# Patient Record
Sex: Female | Born: 2000 | Race: White | Hispanic: No | Marital: Single | Smoking: Never smoker
Health system: Southern US, Community
[De-identification: ages and names within clinical notes are randomized; demographics above are authoritative.]

## PROBLEM LIST (undated history)

## (undated) DIAGNOSIS — F909 Attention-deficit hyperactivity disorder, unspecified type: Secondary | ICD-10-CM

## (undated) DIAGNOSIS — F419 Anxiety disorder, unspecified: Secondary | ICD-10-CM

## (undated) DIAGNOSIS — Z8489 Family history of other specified conditions: Secondary | ICD-10-CM

## (undated) DIAGNOSIS — R21 Rash and other nonspecific skin eruption: Secondary | ICD-10-CM

## (undated) DIAGNOSIS — F32A Depression, unspecified: Secondary | ICD-10-CM

## (undated) HISTORY — DX: Rash and other nonspecific skin eruption: R21

## (undated) HISTORY — DX: Anxiety disorder, unspecified: F41.9

## (undated) HISTORY — DX: Attention-deficit hyperactivity disorder, unspecified type: F90.9

## (undated) HISTORY — PX: WISDOM TOOTH EXTRACTION: SHX21

---
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
## 2022-01-05 NOTE — Progress Notes (Unsigned)
   Established Patient Office Visit  Subjective   Patient ID: Victoria Hunter, female   DOB: [birth year] Age: 21 y.o. MRN: [id number]   No chief complaint on file.   HPI Pleasant 21 year old female presenting today to follow up on mood. About 6 weeks ago, her Wellbutrin dose was increased to 300mg  daily. She continued her Lexapro at 20mg  daily as instructed.   ROS    Objective:    There were no vitals filed for this visit.  Physical Exam   No results found for this or any previous visit (from the past 24 hour(s)).   {Labs (Optional):23779}  The ASCVD Risk score (Arnett DK, et al., 2019) failed to calculate for the following reasons:   The 2019 ASCVD risk score is only valid for ages 58 to 88   Assessment & Plan:   No problem-specific Assessment & Plan notes found for this encounter.   No follow-ups on file.  ___________________________________________ 43, DNP, APRN, FNP-BC Primary Care and Sports Medicine [facility] [place]

## 2022-01-06 ENCOUNTER — Ambulatory Visit (INDEPENDENT_AMBULATORY_CARE_PROVIDER_SITE_OTHER): Payer: Managed Care, Other (non HMO) | Admitting: Obstetrics & Gynecology

## 2022-01-06 ENCOUNTER — Encounter: Payer: Self-pay | Admitting: Obstetrics & Gynecology

## 2022-01-06 ENCOUNTER — Encounter: Payer: Self-pay | Admitting: Medical-Surgical

## 2022-01-06 ENCOUNTER — Ambulatory Visit (INDEPENDENT_AMBULATORY_CARE_PROVIDER_SITE_OTHER): Payer: Managed Care, Other (non HMO) | Admitting: Medical-Surgical

## 2022-01-06 VITALS — BP 123/85 | HR 119 | Resp 20 | Ht 64.0 in | Wt 164.0 lb

## 2022-01-06 VITALS — BP 129/91 | HR 106 | Resp 16 | Ht 64.0 in | Wt 164.0 lb

## 2022-01-06 DIAGNOSIS — F411 Generalized anxiety disorder: Secondary | ICD-10-CM | POA: Diagnosis not present

## 2022-01-06 DIAGNOSIS — F331 Major depressive disorder, recurrent, moderate: Secondary | ICD-10-CM | POA: Diagnosis not present

## 2022-01-06 DIAGNOSIS — Q524 Other congenital malformations of vagina: Secondary | ICD-10-CM

## 2022-01-06 DIAGNOSIS — Z01419 Encounter for gynecological examination (general) (routine) without abnormal findings: Secondary | ICD-10-CM

## 2022-01-06 NOTE — Progress Notes (Signed)
Subjective:     Victoria Hunter is a 21 y.o. female here for a routine exam.  Current complaints: hymal septum; can wear tampons with no issue.  No leaking around the tampon.  Has not had intercourse.    Gynecologic History Patient's last menstrual period was 12/12/2021. Contraception: abstinence Last Pap: n/a--age 42.   Obstetric History OB History  Gravida Para Term Preterm AB Living  0 0 0 0 0 0  SAB IAB Ectopic Multiple Live Births  0 0 0 0 0     The following portions of the patient's history were reviewed and updated as appropriate: allergies, current medications, past family history, past medical history, past social history, past surgical history, and problem list.  Review of Systems Pertinent items noted in HPI and remainder of comprehensive ROS otherwise negative.    Objective:     Vitals:   01/06/22 0956  BP: (!) 129/91  Pulse: (!) 106  Resp: 16  Weight: 164 lb (74.4 kg)  Height: 5\' 4"  (1.626 m)   Vitals:  WNL General appearance: alert, cooperative and no distress  HEENT: Normocephalic, without obvious abnormality, atraumatic Eyes: negative Throat: lips, mucosa, and tongue normal; teeth and gums normal  Respiratory: Clear to auscultation bilaterally  CV: Regular rate and rhythm  Breasts:  Normal appearance, no masses or tenderness, no nipple retraction or dimpling  GI: Soft, non-tender; bowel sounds normal; no masses,  no organomegaly  GU: External Genitalia:  Tanner V, no lesion Urethra:  No prolapse   Vagina: Pink, normal rugae, midline hymenal septum; thin.  No vaginal septum appreciated on exam.  Cervix: Not examined  Uterus:  Not examined  Adnexa: Not examined  Musculoskeletal: No edema, redness or tenderness in the calves or thighs  Skin: No lesions or rash  Lymphatic: Axillary adenopathy: none     Psychiatric: Normal mood and behavior        Assessment:    Healthy female exam.  Thin hymenal septum   Plan:    1.  Removal of hymenal septum  and exam under anesthesia  2.  Discussed birth control and condoms for STD protection (not sexually active at this time).  Has had a boyfriend in the past and may also have interest in females.

## 2022-01-23 ENCOUNTER — Telehealth: Payer: Self-pay | Admitting: *Deleted

## 2022-01-23 NOTE — Telephone Encounter (Signed)
Left patient a message about surgery scheduling. Patient's mother thought it was a referral.

## 2022-02-20 ENCOUNTER — Encounter (HOSPITAL_COMMUNITY): Payer: Self-pay | Admitting: Obstetrics & Gynecology

## 2022-02-20 NOTE — Progress Notes (Signed)
Victoria Hunter denies chest pain or shortness of breath. Patient denies having any s/s of Covid in her household, also denies any known exposure to Covid.   Nyelle's PCP is Christen Butter, NP.

## 2022-02-21 ENCOUNTER — Ambulatory Visit (HOSPITAL_COMMUNITY): Payer: Managed Care, Other (non HMO) | Admitting: Anesthesiology

## 2022-02-21 ENCOUNTER — Ambulatory Visit (HOSPITAL_BASED_OUTPATIENT_CLINIC_OR_DEPARTMENT_OTHER): Payer: Managed Care, Other (non HMO) | Admitting: Anesthesiology

## 2022-02-21 ENCOUNTER — Ambulatory Visit (HOSPITAL_COMMUNITY)
Admission: RE | Admit: 2022-02-21 | Discharge: 2022-02-21 | Disposition: A | Discharge status: Home / Self Care | Payer: Managed Care, Other (non HMO) | Source: Ambulatory Visit | Attending: Obstetrics & Gynecology | Admitting: Obstetrics & Gynecology

## 2022-02-21 ENCOUNTER — Other Ambulatory Visit: Payer: Self-pay

## 2022-02-21 ENCOUNTER — Encounter (HOSPITAL_COMMUNITY): Payer: Self-pay | Admitting: Obstetrics & Gynecology

## 2022-02-21 ENCOUNTER — Encounter (HOSPITAL_COMMUNITY)
Admission: RE | Disposition: A | Discharge status: Home / Self Care | Payer: Self-pay | Source: Ambulatory Visit | Attending: Obstetrics & Gynecology

## 2022-02-21 DIAGNOSIS — Q524 Other congenital malformations of vagina: Secondary | ICD-10-CM | POA: Diagnosis present

## 2022-02-21 DIAGNOSIS — Q523 Imperforate hymen: Secondary | ICD-10-CM

## 2022-02-21 HISTORY — PX: HYMENECTOMY: SHX5853

## 2022-02-21 HISTORY — DX: Family history of other specified conditions: Z84.89

## 2022-02-21 HISTORY — DX: Depression, unspecified: F32.A

## 2022-02-21 LAB — CBC
HCT: 38.4 % (ref 36.0–46.0)
Hemoglobin: 12.7 g/dL (ref 12.0–15.0)
MCH: 29.1 pg (ref 26.0–34.0)
MCHC: 33.1 g/dL (ref 30.0–36.0)
MCV: 88.1 fL (ref 80.0–100.0)
Platelets: 364 10*3/uL (ref 150–400)
RBC: 4.36 MIL/uL (ref 3.87–5.11)
RDW: 12.2 % (ref 11.5–15.5)
WBC: 6.5 10*3/uL (ref 4.0–10.5)
nRBC: 0 % (ref 0.0–0.2)

## 2022-02-21 LAB — POCT PREGNANCY, URINE: Preg Test, Ur: NEGATIVE

## 2022-02-21 SURGERY — EXAM UNDER ANESTHESIA
Anesthesia: General | Site: Vagina

## 2022-02-21 MED ORDER — LACTATED RINGERS IV SOLN: INTRAVENOUS | Status: DC

## 2022-02-21 MED ORDER — PROPOFOL 10 MG/ML IV BOLUS
INTRAVENOUS | Status: DC | PRN
  Administered 2022-02-21: 200 mg via INTRAVENOUS

## 2022-02-21 MED ORDER — KETOROLAC TROMETHAMINE 30 MG/ML IJ SOLN
INTRAMUSCULAR | Status: DC | PRN
  Administered 2022-02-21: 30 mg via INTRAVENOUS

## 2022-02-21 MED ORDER — FENTANYL CITRATE (PF) 250 MCG/5ML IJ SOLN
INTRAMUSCULAR | Status: DC | PRN
Start: 2022-02-21 — End: 2022-02-21
  Administered 2022-02-21: 100 ug via INTRAVENOUS

## 2022-02-21 MED ORDER — ONDANSETRON HCL 4 MG/2ML IJ SOLN
INTRAMUSCULAR | Status: AC
  Filled 2022-02-21: qty 2

## 2022-02-21 MED ORDER — LIDOCAINE HCL 1 % IJ SOLN
INTRAMUSCULAR | Status: AC
Start: 2022-02-21 — End: ?
  Filled 2022-02-21: qty 20

## 2022-02-21 MED ORDER — LIDOCAINE 2% (20 MG/ML) 5 ML SYRINGE
INTRAMUSCULAR | Status: DC | PRN
  Administered 2022-02-21: 60 mg via INTRAVENOUS

## 2022-02-21 MED ORDER — LIDOCAINE 2% (20 MG/ML) 5 ML SYRINGE
INTRAMUSCULAR | Status: AC
Start: 2022-02-21 — End: ?
  Filled 2022-02-21: qty 5

## 2022-02-21 MED ORDER — MIDAZOLAM HCL 2 MG/2ML IJ SOLN
INTRAMUSCULAR | Status: AC
Start: 2022-02-21 — End: ?
  Filled 2022-02-21: qty 2

## 2022-02-21 MED ORDER — SCOPOLAMINE 1 MG/3DAYS TD PT72: 1.0000 | MEDICATED_PATCH | TRANSDERMAL | Status: DC

## 2022-02-21 MED ORDER — LIDOCAINE HCL 1 % IJ SOLN
INTRAMUSCULAR | Status: DC | PRN
  Administered 2022-02-21: 2 mL

## 2022-02-21 MED ORDER — SCOPOLAMINE 1 MG/3DAYS TD PT72
MEDICATED_PATCH | TRANSDERMAL | Status: AC
  Administered 2022-02-21: 1.5 mg via TRANSDERMAL
  Filled 2022-02-21: qty 1

## 2022-02-21 MED ORDER — CHLORHEXIDINE GLUCONATE 0.12 % MT SOLN: 15.0000 mL | Freq: Once | OROMUCOSAL | Status: AC

## 2022-02-21 MED ORDER — ONDANSETRON HCL 4 MG/2ML IJ SOLN
INTRAMUSCULAR | Status: DC | PRN
  Administered 2022-02-21: 4 mg via INTRAVENOUS

## 2022-02-21 MED ORDER — ACETAMINOPHEN 500 MG PO TABS
ORAL_TABLET | ORAL | Status: AC
  Administered 2022-02-21: 1000 mg via ORAL
  Filled 2022-02-21: qty 2

## 2022-02-21 MED ORDER — ORAL CARE MOUTH RINSE: 15.0000 mL | Freq: Once | OROMUCOSAL | Status: AC

## 2022-02-21 MED ORDER — MIDAZOLAM HCL 2 MG/2ML IJ SOLN
INTRAMUSCULAR | Status: DC | PRN
  Administered 2022-02-21: 2 mg via INTRAVENOUS

## 2022-02-21 MED ORDER — 0.9 % SODIUM CHLORIDE (POUR BTL) OPTIME
TOPICAL | Status: DC | PRN
  Administered 2022-02-21: 1000 mL

## 2022-02-21 MED ORDER — ACETAMINOPHEN 500 MG PO TABS: 1000.0000 mg | ORAL_TABLET | Freq: Once | ORAL | Status: AC

## 2022-02-21 MED ORDER — CHLORHEXIDINE GLUCONATE 0.12 % MT SOLN
OROMUCOSAL | Status: AC
  Administered 2022-02-21: 15 mL via OROMUCOSAL
  Filled 2022-02-21: qty 15

## 2022-02-21 MED ORDER — PROPOFOL 10 MG/ML IV BOLUS
INTRAVENOUS | Status: AC
  Filled 2022-02-21: qty 20

## 2022-02-21 MED ORDER — DEXAMETHASONE SODIUM PHOSPHATE 10 MG/ML IJ SOLN
INTRAMUSCULAR | Status: DC | PRN
  Administered 2022-02-21: 10 mg via INTRAVENOUS

## 2022-02-21 MED ORDER — KETOROLAC TROMETHAMINE 30 MG/ML IJ SOLN
INTRAMUSCULAR | Status: AC
  Filled 2022-02-21: qty 1

## 2022-02-21 MED ORDER — FENTANYL CITRATE (PF) 250 MCG/5ML IJ SOLN
INTRAMUSCULAR | Status: AC
  Filled 2022-02-21: qty 5

## 2022-02-21 MED ORDER — DEXAMETHASONE SODIUM PHOSPHATE 10 MG/ML IJ SOLN
INTRAMUSCULAR | Status: AC
  Filled 2022-02-21: qty 1

## 2022-02-21 MED ORDER — ROCURONIUM BROMIDE 10 MG/ML (PF) SYRINGE
PREFILLED_SYRINGE | INTRAVENOUS | Status: AC
  Filled 2022-02-21: qty 10

## 2022-02-21 SURGICAL SUPPLY — 21 items
ELECT REM PT RETURN 9FT ADLT (ELECTROSURGICAL) ×1
ELECTRODE REM PT RTRN 9FT ADLT (ELECTROSURGICAL) IMPLANT
GLOVE BIO SURGEON STRL SZ7 (GLOVE) ×1 IMPLANT
GLOVE BIOGEL PI IND STRL 6.5 (GLOVE) ×1 IMPLANT
GLOVE BIOGEL PI IND STRL 7.0 (GLOVE) ×1 IMPLANT
GLOVE BIOGEL PI INDICATOR 6.5 (GLOVE) ×1
GLOVE BIOGEL PI INDICATOR 7.0 (GLOVE) ×1
GOWN STRL REUS W/ TWL LRG LVL3 (GOWN DISPOSABLE) ×2 IMPLANT
GOWN STRL REUS W/TWL LRG LVL3 (GOWN DISPOSABLE) ×2
NEEDLE HYPO 22GX1.5 SAFETY (NEEDLE) ×1 IMPLANT
NS IRRIG 1000ML POUR BTL (IV SOLUTION) ×1 IMPLANT
PACK VAGINAL MINOR WOMEN LF (CUSTOM PROCEDURE TRAY) ×1 IMPLANT
PAD OB MATERNITY 4.3X12.25 (PERSONAL CARE ITEMS) ×1 IMPLANT
PENCIL BUTTON HOLSTER BLD 10FT (ELECTRODE) IMPLANT
SUT MON AB 3-0 SH 27 (SUTURE) ×1
SUT MON AB 3-0 SH27 (SUTURE) ×2 IMPLANT
SUT VIC AB 3-0 SH 27 (SUTURE) ×1
SUT VIC AB 3-0 SH 27XBRD (SUTURE) IMPLANT
SYR 20ML LL LF (SYRINGE) ×1 IMPLANT
TOWEL GREEN STERILE FF (TOWEL DISPOSABLE) ×2 IMPLANT
YANKAUER SUCT BULB TIP NO VENT (SUCTIONS) IMPLANT

## 2022-02-21 NOTE — Op Note (Signed)
PATIENT:  Victoria Hunter  21 y.o. female  PRE-OPERATIVE DIAGNOSIS:  Thin hymenal septum  POST-OPERATIVE DIAGNOSIS:  Thin hymenal septum  PROCEDURE:  Procedure(s): EXAM UNDER ANESTHESIA (N/A) REMOVAL OF THIN HYMENAL SEPTUM (N/A)  SURGEON:  Surgeon(s) and Role:    * Lesly Dukes, MD - Primary  ANESTHESIA:   local and general  EBL:  0 mL   BLOOD ADMINISTERED:none  DRAINS: none   LOCAL MEDICATIONS USED:  LIDOCAINE   SPECIMEN:  No Specimen  DISPOSITION OF SPECIMEN:  N/A  COUNTS:  correct  TOURNIQUET:  * No tourniquets in log *  DICTATION: .Dragon Dictation  PLAN OF CARE: Discharge to home after PACU  PATIENT DISPOSITION:  PACU - hemodynamically stable.  PROCEDURE: After informed consent was obtained patient is taken to the operating room where general anesthesia was induced.  Patient was placed in the dorsal lithotomy position and prepared and draped in normal sterile fashion.  Exam under anesthesia was performed and the septate hymen was was the only vaginal abnormality.  Hemostats were used to clamp the septate hymen and it was transected and suture-ligated with 3-0 Monocryl.  This was done in both the superior and inferior aspects.  There was excellent hemostasis at the end of the procedure.  Patient went to the recovery room in stable condition.

## 2022-02-21 NOTE — Discharge Instructions (Signed)
Ibuprofen or Tylenol for pain Ice packs as needed Use mild soap to clean Use a spray bottle when urinating as needed Nothing per vagina for 2 weeks (no tampons) Call with heavy bleeding (spotting is OK), fever, uncontrolled pain, foul smelling discharge.

## 2022-02-21 NOTE — H&P (Signed)
Victoria Hunter is an 21 y.o. female. With a a septate hymen.  Pt is not sexually active yet and the septate hymen would make sexual intercourse difficult and painful.  Pertinent Gynecological History: Menses:  regular Bleeding: no intermenstraul bleeding Contraception: abstinence DES exposure: denies Blood transfusions: none Sexually transmitted diseases: no past history Previous GYN Procedures:  none   Last mammogram:  n/a   Last pap:  n/a age <19     Menstrual History:  Patient's last menstrual period was 02/17/2022.    Past Medical History:  Diagnosis Date   ADHD    Anxiety    Depression    Family history of adverse reaction to anesthesia    brother N/V   Rash     Past Surgical History:  Procedure Laterality Date   WISDOM TOOTH EXTRACTION      Family History  Problem Relation Age of Onset   Hypertension Mother    Cervical cancer Maternal Grandmother    Lung cancer Paternal Grandmother    Lung cancer Paternal Grandfather     Social History:  reports that she has never smoked. She has never used smokeless tobacco. She reports that she does not drink alcohol and does not use drugs.  Allergies: No Known Allergies  Medications Prior to Admission  Medication Sig Dispense Refill Last Dose   acetaminophen (TYLENOL) 500 MG tablet Take 1,000 mg by mouth every 6 (six) hours as needed (pain.).      buPROPion (WELLBUTRIN XL) 300 MG 24 hr tablet Take 1 tablet (300 mg total) by mouth every morning. 90 tablet 1    escitalopram (LEXAPRO) 20 MG tablet Take 1 tablet (20 mg total) by mouth daily. 90 tablet 1    ibuprofen (ADVIL) 200 MG tablet Take 400 mg by mouth every 8 (eight) hours as needed (pain.).      lisdexamfetamine (VYVANSE) 20 MG capsule Take 1 capsule (20 mg total) by mouth every morning. 30 capsule 0    lisdexamfetamine (VYVANSE) 20 MG capsule Take 1 capsule (20 mg total) by mouth every morning. (Patient not taking: Reported on 02/17/2022) 30 capsule 0 Not Taking    lisdexamfetamine (VYVANSE) 20 MG capsule Take 1 capsule (20 mg total) by mouth every morning. (Patient not taking: Reported on 02/17/2022) 30 capsule 0 Not Taking    Review of Systems  Blood pressure 118/79, pulse (!) 107, temperature 98.2 F (36.8 C), temperature source Oral, resp. rate 18, height 5\' 4"  (1.626 m), weight 74.8 kg, last menstrual period 02/17/2022, SpO2 95 %. Physical Exam  No results found for this or any previous visit (from the past 24 hour(s)).  No results found.  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  Office examination was uncomfortable and will proceed to removal in OR  02/19/2022 02/21/2022, 10:08 AM

## 2022-02-21 NOTE — Anesthesia Preprocedure Evaluation (Addendum)
Anesthesia Evaluation  Patient identified by MRN, date of birth, ID band Patient awake    Reviewed: Allergy & Precautions, NPO status , Patient's Chart, lab work & pertinent test results  History of Anesthesia Complications Negative for: history of anesthetic complications  Airway Mallampati: I  TM Distance: >3 FB Neck ROM: Full    Dental  (+) Teeth Intact, Dental Advisory Given   Pulmonary neg pulmonary ROS,    Pulmonary exam normal breath sounds clear to auscultation       Cardiovascular negative cardio ROS Normal cardiovascular exam Rhythm:Regular Rate:Normal     Neuro/Psych PSYCHIATRIC DISORDERS Anxiety Depression negative neurological ROS     GI/Hepatic negative GI ROS, Neg liver ROS,   Endo/Other  negative endocrine ROS  Renal/GU negative Renal ROS  negative genitourinary   Musculoskeletal negative musculoskeletal ROS (+)   Abdominal   Peds  Hematology negative hematology ROS (+)   Anesthesia Other Findings   Reproductive/Obstetrics negative OB ROS                           Anesthesia Physical Anesthesia Plan  ASA: 1  Anesthesia Plan: General   Post-op Pain Management: Tylenol PO (pre-op)*, Toradol IV (intra-op)* and Precedex   Induction: Intravenous  PONV Risk Score and Plan: 4 or greater and Ondansetron, Dexamethasone, Midazolam, Scopolamine patch - Pre-op and Treatment may vary due to age or medical condition  Airway Management Planned: LMA  Additional Equipment: None  Intra-op Plan:   Post-operative Plan: Extubation in OR  Informed Consent: I have reviewed the patients History and Physical, chart, labs and discussed the procedure including the risks, benefits and alternatives for the proposed anesthesia with the patient or authorized representative who has indicated his/her understanding and acceptance.     Dental advisory given  Plan Discussed with:  CRNA  Anesthesia Plan Comments:        Anesthesia Quick Evaluation

## 2022-02-21 NOTE — Brief Op Note (Signed)
02/21/2022  1:35 PM  PATIENT:  Victoria Hunter  21 y.o. female  PRE-OPERATIVE DIAGNOSIS:  Thin hymenal septum  POST-OPERATIVE DIAGNOSIS:  Thin hymenal septum  PROCEDURE:  Procedure(s): EXAM UNDER ANESTHESIA (N/A) REMOVAL OF THIN HYMENAL SEPTUM (N/A)  SURGEON:  Surgeon(s) and Role:    * Lesly Dukes, MD - Primary  ANESTHESIA:   local and general  EBL:  0 mL   BLOOD ADMINISTERED:none  DRAINS: none   LOCAL MEDICATIONS USED:  LIDOCAINE   SPECIMEN:  No Specimen  DISPOSITION OF SPECIMEN:  N/A  COUNTS:  correct  TOURNIQUET:  * No tourniquets in log *  DICTATION: .Dragon Dictation  PLAN OF CARE: Discharge to home after PACU  PATIENT DISPOSITION:  PACU - hemodynamically stable.   Delay start of Pharmacological VTE agent (>24hrs) due to surgical blood loss or risk of bleeding: not applicable

## 2022-02-21 NOTE — Transfer of Care (Signed)
Immediate Anesthesia Transfer of Care Note  Patient: Victoria Hunter  Procedure(s) Performed: Francia Greaves UNDER ANESTHESIA (Vagina ) REMOVAL OF THIN HYMENAL SEPTUM (Vagina )  Patient Location: PACU  Anesthesia Type:General  Level of Consciousness: sedated, patient cooperative and responds to stimulation  Airway & Oxygen Therapy: Patient Spontanous Breathing and Patient connected to face mask oxygen  Post-op Assessment: Report given to RN, Post -op Vital signs reviewed and stable, Patient moving all extremities X 4 and Patient able to stick tongue midline  Post vital signs: Reviewed  Last Vitals:  Vitals Value Taken Time  BP 98/69 02/21/22 1333  Temp 97.6   Pulse 61 02/21/22 1335  Resp 17 02/21/22 1335  SpO2 100 % 02/21/22 1335  Vitals shown include unvalidated device data.  Last Pain:  Vitals:   02/21/22 1019  TempSrc:   PainSc: 0-No pain         Complications: No notable events documented.

## 2022-02-21 NOTE — Anesthesia Postprocedure Evaluation (Signed)
Anesthesia Post Note  Patient: Kamille Toomey  Procedure(s) Performed: EXAM UNDER ANESTHESIA (Vagina ) REMOVAL OF THIN HYMENAL SEPTUM (Vagina )     Patient location during evaluation: PACU Anesthesia Type: General Level of consciousness: awake and alert Pain management: pain level controlled Vital Signs Assessment: post-procedure vital signs reviewed and stable Respiratory status: spontaneous breathing, nonlabored ventilation and respiratory function stable Cardiovascular status: blood pressure returned to baseline and stable Postop Assessment: no apparent nausea or vomiting Anesthetic complications: no   No notable events documented.  Last Vitals:  Vitals:   02/21/22 1345 02/21/22 1400  BP: 95/62 105/74  Pulse: 62 80  Resp: 14 15  Temp:    SpO2: 100% 97%    Last Pain:  Vitals:   02/21/22 1400  TempSrc:   PainSc: 0-No pain                 Lannie Fields

## 2022-02-21 NOTE — Anesthesia Procedure Notes (Signed)
Procedure Name: LMA Insertion Date/Time: 02/21/2022 12:59 PM  Performed by: Cy Blamer, CRNAPre-anesthesia Checklist: Patient identified, Emergency Drugs available, Suction available, Patient being monitored and Timeout performed Patient Re-evaluated:Patient Re-evaluated prior to induction Oxygen Delivery Method: Circle system utilized Preoxygenation: Pre-oxygenation with 100% oxygen Induction Type: IV induction LMA: LMA inserted LMA Size: 4.0 Number of attempts: 1 Placement Confirmation: positive ETCO2 and breath sounds checked- equal and bilateral Tube secured with: Tape Dental Injury: Teeth and Oropharynx as per pre-operative assessment

## 2022-02-22 ENCOUNTER — Encounter (HOSPITAL_COMMUNITY): Payer: Self-pay | Admitting: Obstetrics & Gynecology

## 2022-03-27 ENCOUNTER — Other Ambulatory Visit: Payer: Self-pay | Admitting: Medical-Surgical

## 2022-03-27 MED ORDER — LISDEXAMFETAMINE DIMESYLATE 20 MG PO CAPS: 20.0000 mg | ORAL_CAPSULE | ORAL | 0 refills | Status: DC

## 2022-04-07 NOTE — Progress Notes (Unsigned)
   Established Patient Office Visit  Subjective   Patient ID: Victoria Hunter, female   DOB: 10-12-2000 Age: 21 y.o. MRN: [id number]   No chief complaint on file.  HPI Pleasant 21 year old female presenting today for follow up on:  Mood:  ADHD:   Objective:    There were no vitals filed for this visit.  Physical Exam   No results found for this or any previous visit (from the past 24 hour(s)).   {Labs (Optional):23779}  The ASCVD Risk score (Arnett DK, et al., 2019) failed to calculate for the following reasons:   The 2019 ASCVD risk score is only valid for ages 62 to 53   Assessment & Plan:   No problem-specific Assessment & Plan notes found for this encounter.   No follow-ups on file.  ___________________________________________ Clearnce Sorrel, DNP, APRN, FNP-BC Primary Care and [place]

## 2022-04-08 ENCOUNTER — Ambulatory Visit (INDEPENDENT_AMBULATORY_CARE_PROVIDER_SITE_OTHER): Payer: Managed Care, Other (non HMO) | Admitting: Medical-Surgical

## 2022-04-08 ENCOUNTER — Encounter: Payer: Self-pay | Admitting: Medical-Surgical

## 2022-04-08 VITALS — BP 119/79 | HR 107 | Resp 20 | Ht 64.0 in | Wt 169.6 lb

## 2022-04-08 DIAGNOSIS — F902 Attention-deficit hyperactivity disorder, combined type: Secondary | ICD-10-CM | POA: Diagnosis not present

## 2022-04-08 DIAGNOSIS — F411 Generalized anxiety disorder: Secondary | ICD-10-CM

## 2022-04-08 MED ORDER — LISDEXAMFETAMINE DIMESYLATE 20 MG PO CAPS: 20.0000 mg | ORAL_CAPSULE | ORAL | 0 refills | Status: DC

## 2022-04-08 MED ORDER — BUPROPION HCL ER (XL) 300 MG PO TB24: 300.0000 mg | ORAL_TABLET | ORAL | 1 refills | Status: DC

## 2022-04-08 MED ORDER — ESCITALOPRAM OXALATE 20 MG PO TABS: 20.0000 mg | ORAL_TABLET | Freq: Every day | ORAL | 1 refills | Status: DC

## 2022-07-02 ENCOUNTER — Other Ambulatory Visit: Payer: Self-pay | Admitting: Medical-Surgical

## 2022-07-02 MED ORDER — LISDEXAMFETAMINE DIMESYLATE 20 MG PO CAPS: 20.0000 mg | ORAL_CAPSULE | ORAL | 0 refills | Status: DC

## 2022-08-29 ENCOUNTER — Other Ambulatory Visit: Payer: Self-pay

## 2022-08-29 MED ORDER — LISDEXAMFETAMINE DIMESYLATE 20 MG PO CAPS: 20.0000 mg | ORAL_CAPSULE | ORAL | 0 refills | Status: DC

## 2022-08-29 NOTE — Telephone Encounter (Signed)
Patient father ( on [place]) called for patient. Requesting prescription for Vyvanse be moved   due to not being available - requesting be sent to  [place] ( added this pharmacy to patient chart ). States his wife works at [employer] and they do have the medication in [place].

## 2022-08-29 NOTE — Telephone Encounter (Signed)
Cancelled vyvanse for February 2024 at [facility] [place]

## 2022-09-29 ENCOUNTER — Telehealth: Payer: Self-pay | Admitting: Medical-Surgical

## 2022-09-29 ENCOUNTER — Other Ambulatory Visit: Payer: Self-pay | Admitting: Medical-Surgical

## 2022-09-29 MED ORDER — LISDEXAMFETAMINE DIMESYLATE 20 MG PO CAPS: 20.0000 mg | ORAL_CAPSULE | ORAL | 0 refills | Status: DC

## 2022-09-29 NOTE — Telephone Encounter (Signed)
Pts father called requesting to have her lisdexamfetamine (VYVANSE) 20 MG capsule [id number] prescription sent to [place], [address] AT [address] . [facility]

## 2022-09-29 NOTE — Telephone Encounter (Signed)
Please cancel the current [facility] at [facility] [place]. The medication has been sent to [facility].

## 2022-09-29 NOTE — Telephone Encounter (Signed)
Cancelled prescription.  

## 2022-10-08 ENCOUNTER — Ambulatory Visit (INDEPENDENT_AMBULATORY_CARE_PROVIDER_SITE_OTHER): Payer: Managed Care, Other (non HMO) | Admitting: Medical-Surgical

## 2022-10-08 ENCOUNTER — Encounter: Payer: Self-pay | Admitting: Medical-Surgical

## 2022-10-08 VITALS — BP 102/72 | HR 103 | Resp 20 | Ht 64.0 in | Wt 174.3 lb

## 2022-10-08 DIAGNOSIS — F902 Attention-deficit hyperactivity disorder, combined type: Secondary | ICD-10-CM

## 2022-10-08 DIAGNOSIS — F411 Generalized anxiety disorder: Secondary | ICD-10-CM

## 2022-10-08 DIAGNOSIS — F331 Major depressive disorder, recurrent, moderate: Secondary | ICD-10-CM | POA: Diagnosis not present

## 2022-10-08 MED ORDER — LISDEXAMFETAMINE DIMESYLATE 20 MG PO CAPS
20.0000 mg | ORAL_CAPSULE | ORAL | 0 refills | Status: DC
Start: 2022-12-07 — End: 2023-01-28

## 2022-10-08 MED ORDER — LISDEXAMFETAMINE DIMESYLATE 20 MG PO CAPS: 20.0000 mg | ORAL_CAPSULE | ORAL | 0 refills | Status: DC

## 2022-10-08 MED ORDER — BUPROPION HCL ER (XL) 300 MG PO TB24: 300.0000 mg | ORAL_TABLET | ORAL | 1 refills | Status: DC

## 2022-10-08 MED ORDER — ESCITALOPRAM OXALATE 20 MG PO TABS: 20.0000 mg | ORAL_TABLET | Freq: Every day | ORAL | 1 refills | Status: DC

## 2022-10-08 MED ORDER — LISDEXAMFETAMINE DIMESYLATE 20 MG PO CAPS
20.0000 mg | ORAL_CAPSULE | ORAL | 0 refills | Status: DC
Start: 2022-10-08 — End: 2023-01-28

## 2022-10-08 NOTE — Progress Notes (Signed)
        Established patient visit  History, exam, impression, and plan:  1. Attention deficit hyperactivity disorder, combined type Treated long-term with Vyvanse 20 mg daily on a stable regimen.  Medication continues to work well for her and she is able to focus and complete tasks in a timely manner.  No significant side effects.  No change in appetite, eating pattern, or sleeping pattern.  Continue Vyvanse 20 mg daily.  2. Generalized anxiety disorder 3. Major depressive disorder, recurrent episode, moderate History of anxiety and depression currently treated with Lexapro 20 mg and Wellbutrin 300 mg daily.  Tolerating both medications well without side effects.  Has been on this regimen with good control of her symptoms.  Does still have some situational stressors however she is able to move along without dwelling on it for long periods of time.  Denies SI/HI.  Continue current medications as prescribed.  Procedures performed this visit: None.  Return in about 6 months (around 04/09/2023) for ADHD/mood follow up.  __________________________________ Thayer Ohm, DNP, APRN, FNP-BC Primary Care and Sports Medicine [facility]

## 2022-10-16 ENCOUNTER — Other Ambulatory Visit: Payer: Self-pay | Admitting: Medical-Surgical

## 2022-10-16 DIAGNOSIS — F331 Major depressive disorder, recurrent, moderate: Secondary | ICD-10-CM

## 2022-10-16 DIAGNOSIS — F411 Generalized anxiety disorder: Secondary | ICD-10-CM

## 2023-01-10 ENCOUNTER — Other Ambulatory Visit: Payer: Self-pay | Admitting: Medical-Surgical

## 2023-01-10 DIAGNOSIS — F331 Major depressive disorder, recurrent, moderate: Secondary | ICD-10-CM

## 2023-01-10 DIAGNOSIS — F411 Generalized anxiety disorder: Secondary | ICD-10-CM

## 2023-01-28 ENCOUNTER — Other Ambulatory Visit: Payer: Self-pay | Admitting: Medical-Surgical

## 2023-01-28 ENCOUNTER — Telehealth: Payer: Self-pay | Admitting: Medical-Surgical

## 2023-01-28 DIAGNOSIS — F902 Attention-deficit hyperactivity disorder, combined type: Secondary | ICD-10-CM

## 2023-01-28 MED ORDER — LISDEXAMFETAMINE DIMESYLATE 20 MG PO CAPS
20.0000 mg | ORAL_CAPSULE | ORAL | 0 refills | Status: DC
Start: 2023-01-28 — End: 2023-04-09

## 2023-01-28 MED ORDER — LISDEXAMFETAMINE DIMESYLATE 20 MG PO CAPS
20.0000 mg | ORAL_CAPSULE | ORAL | 0 refills | Status: DC
Start: 2023-02-27 — End: 2023-04-09

## 2023-01-28 MED ORDER — LISDEXAMFETAMINE DIMESYLATE 20 MG PO CAPS
20.0000 mg | ORAL_CAPSULE | ORAL | 0 refills | Status: DC
Start: 2023-03-29 — End: 2023-04-09

## 2023-01-28 NOTE — Telephone Encounter (Signed)
Patient's father called and is requesting a med refill on Vyvanse. Please advise  [facility] [phone]

## 2023-04-08 NOTE — Progress Notes (Unsigned)
        Established patient visit  History, exam, impression, and plan:  No problem-specific Assessment & Plan notes found for this encounter.   Procedures performed this visit: None.  No follow-ups on file.  __________________________________ Thayer Ohm, DNP, APRN, FNP-BC Primary Care and Sports Medicine [facility]

## 2023-04-09 ENCOUNTER — Ambulatory Visit (INDEPENDENT_AMBULATORY_CARE_PROVIDER_SITE_OTHER): Payer: Managed Care, Other (non HMO) | Admitting: Medical-Surgical

## 2023-04-09 ENCOUNTER — Encounter: Payer: Self-pay | Admitting: Medical-Surgical

## 2023-04-09 VITALS — BP 112/75 | HR 85 | Resp 20 | Ht 64.0 in | Wt 181.1 lb

## 2023-04-09 DIAGNOSIS — F902 Attention-deficit hyperactivity disorder, combined type: Secondary | ICD-10-CM | POA: Diagnosis not present

## 2023-04-09 DIAGNOSIS — F331 Major depressive disorder, recurrent, moderate: Secondary | ICD-10-CM | POA: Diagnosis not present

## 2023-04-09 DIAGNOSIS — F411 Generalized anxiety disorder: Secondary | ICD-10-CM

## 2023-04-09 MED ORDER — BUPROPION HCL ER (XL) 150 MG PO TB24: 150.0000 mg | ORAL_TABLET | Freq: Every day | ORAL | 0 refills | Status: DC

## 2023-04-09 MED ORDER — LISDEXAMFETAMINE DIMESYLATE 20 MG PO CAPS
20.0000 mg | ORAL_CAPSULE | ORAL | 0 refills | Status: DC
Start: 2023-06-08 — End: 2023-09-04

## 2023-04-09 MED ORDER — LISDEXAMFETAMINE DIMESYLATE 20 MG PO CAPS
20.0000 mg | ORAL_CAPSULE | ORAL | 0 refills | Status: DC
Start: 2023-05-09 — End: 2023-09-04

## 2023-04-09 MED ORDER — LISDEXAMFETAMINE DIMESYLATE 20 MG PO CAPS
20.0000 mg | ORAL_CAPSULE | ORAL | 0 refills | Status: DC
Start: 2023-04-09 — End: 2023-05-07

## 2023-05-02 ENCOUNTER — Other Ambulatory Visit: Payer: Self-pay | Admitting: Medical-Surgical

## 2023-05-05 NOTE — Telephone Encounter (Signed)
Upcoming Appointment 05/07/2023

## 2023-05-06 NOTE — Telephone Encounter (Signed)
This was added as a trial for 4 weeks to evaluate tolerance and response.  Plan to discuss at her appointment coming up tomorrow to determine if it has been helpful.  If so, plan to send a 90-day supply to satisfy insurance requirements.

## 2023-05-07 ENCOUNTER — Ambulatory Visit (INDEPENDENT_AMBULATORY_CARE_PROVIDER_SITE_OTHER): Payer: Managed Care, Other (non HMO) | Admitting: Medical-Surgical

## 2023-05-07 ENCOUNTER — Encounter: Payer: Self-pay | Admitting: Medical-Surgical

## 2023-05-07 VITALS — BP 108/74 | HR 117 | Resp 20 | Ht 64.0 in | Wt 178.1 lb

## 2023-05-07 DIAGNOSIS — F902 Attention-deficit hyperactivity disorder, combined type: Secondary | ICD-10-CM

## 2023-05-07 DIAGNOSIS — F411 Generalized anxiety disorder: Secondary | ICD-10-CM | POA: Diagnosis not present

## 2023-05-07 DIAGNOSIS — F331 Major depressive disorder, recurrent, moderate: Secondary | ICD-10-CM | POA: Diagnosis not present

## 2023-05-07 MED ORDER — ESCITALOPRAM OXALATE 20 MG PO TABS: 20.0000 mg | ORAL_TABLET | Freq: Every day | ORAL | 3 refills | Status: DC

## 2023-05-07 MED ORDER — BUPROPION HCL ER (XL) 300 MG PO TB24
300.0000 mg | ORAL_TABLET | ORAL | 3 refills | Status: DC
Start: 2023-05-07 — End: 2024-02-02

## 2023-05-07 MED ORDER — BUPROPION HCL ER (XL) 150 MG PO TB24: 150.0000 mg | ORAL_TABLET | Freq: Every day | ORAL | 3 refills | Status: DC

## 2023-05-07 MED ORDER — LISDEXAMFETAMINE DIMESYLATE 20 MG PO CAPS: 20.0000 mg | ORAL_CAPSULE | ORAL | 0 refills | Status: DC

## 2023-05-07 NOTE — Progress Notes (Signed)
        Established patient visit  History, exam, impression, and plan:  1. Generalized anxiety disorder 2. Major depressive disorder, recurrent episode, moderate (HCC) Pleasant 22 year old female presenting today to follow-up on anxiety and depressive disorders.  At approximately 4 weeks ago, we increased her Wellbutrin to 450 mg daily while continuing her Lexapro 20 mg daily.  Has tolerated the increase in her medication well.  Notes that sometimes she has difficulty falling asleep at night but is not sure if this is the medication or just a typical pattern for her.  Feels the medication at the increased dose has helped greatly and has no complaints.  Denies SI/HI.  Mood stable on this regimen.  Continue Wellbutrin 450 mg daily and Lexapro 20 mg daily. - buPROPion (WELLBUTRIN XL) 300 MG 24 hr tablet; Take 1 tablet (300 mg total) by mouth every morning.  Dispense: 90 tablet; Refill: 3 - escitalopram (LEXAPRO) 20 MG tablet; Take 1 tablet (20 mg total) by mouth daily.  Dispense: 90 tablet; Refill: 3  3. Attention deficit hyperactivity disorder, combined type Vyvanse 20 mg daily is working very well for her and is well-tolerated.  No side effects or concerns.  Refilling today. - lisdexamfetamine (VYVANSE) 20 MG capsule; Take 1 capsule (20 mg total) by mouth every morning.  Dispense: 30 capsule; Refill: 0  Procedures performed this visit: None.  Return in about 6 months (around 11/04/2023) for ADHD/mood follow up.  __________________________________ Thayer Ohm, DNP, APRN, FNP-BC Primary Care and Sports Medicine [facility] [place]

## 2023-05-11 IMAGING — US US ABDOMEN COMPLETE
1 series · 14 of 25 positions shown · non-contrast
Comparison: None.

CLINICAL DATA: Umbilical pain

EXAM:
ABDOMEN ULTRASOUND COMPLETE

[Series 1: us abdomen complete · 14 of 90 slices shown]
[im 1/90]
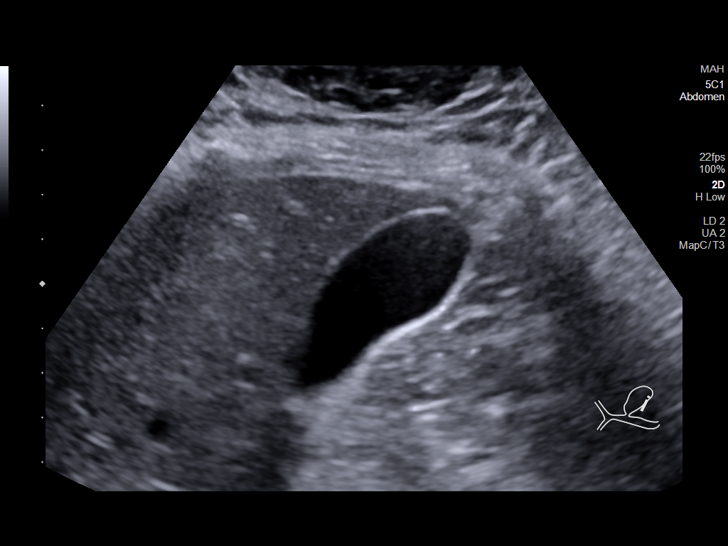
[im 8/90]
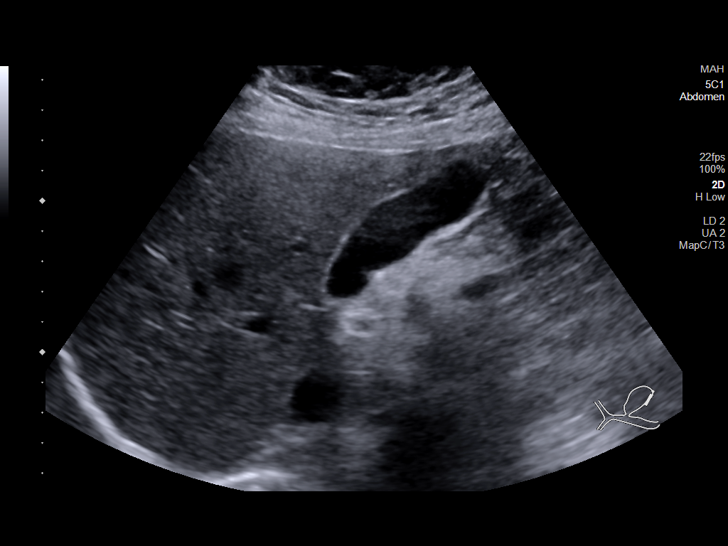
[im 15/90]
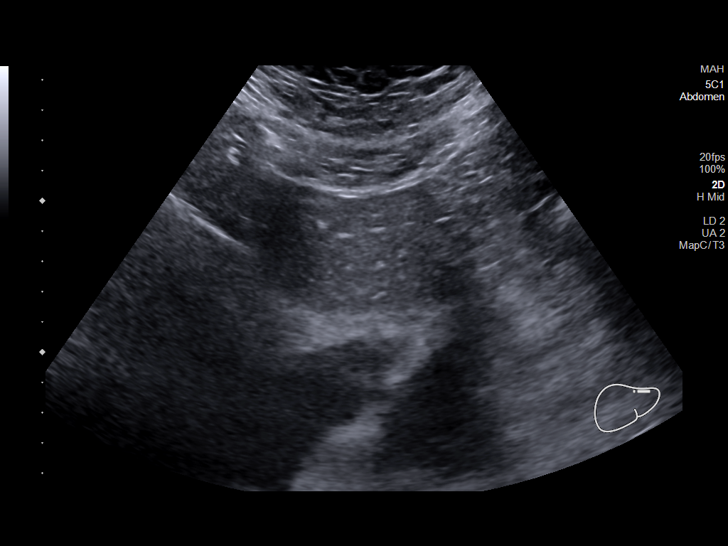
[im 23/90]
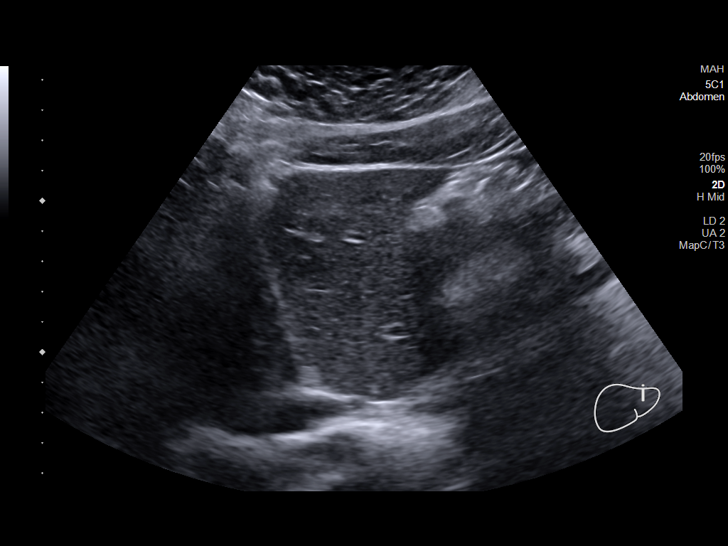
[im 30/90]
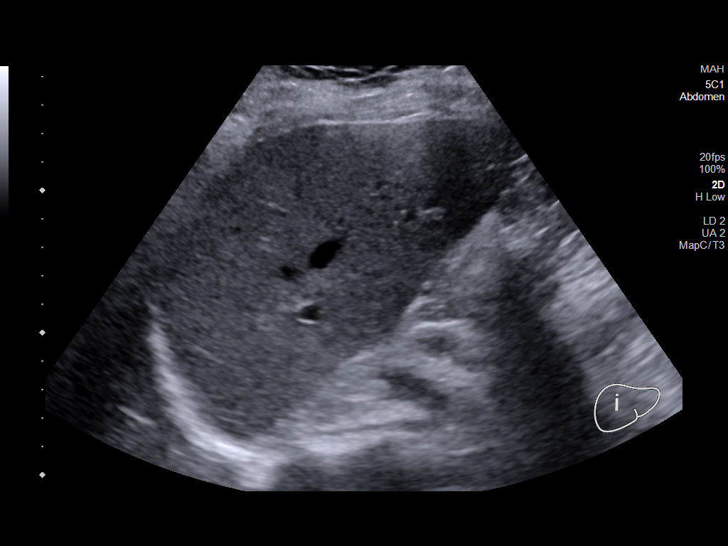
[im 34/90]
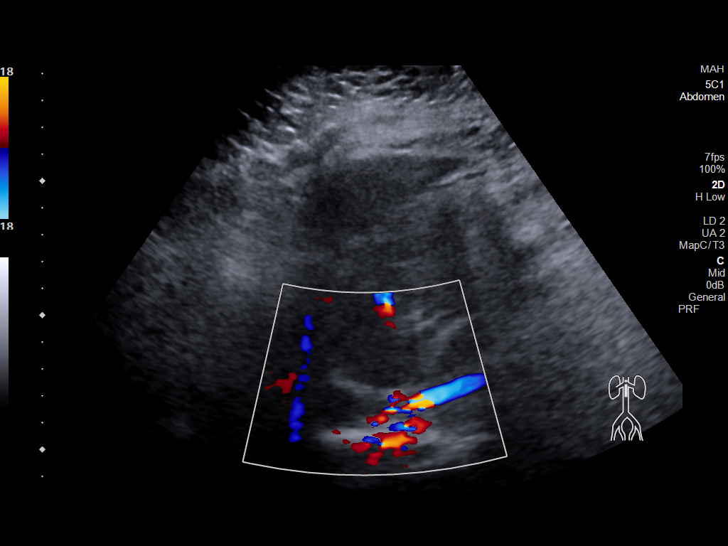
[im 41/90]
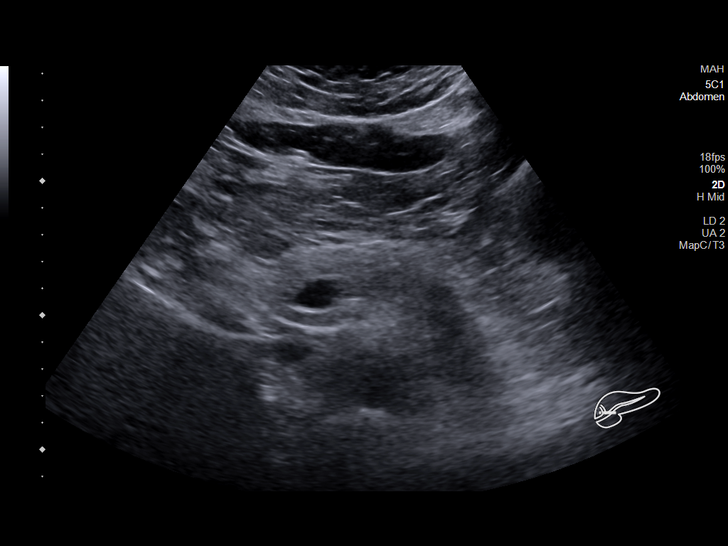
[im 49/90]
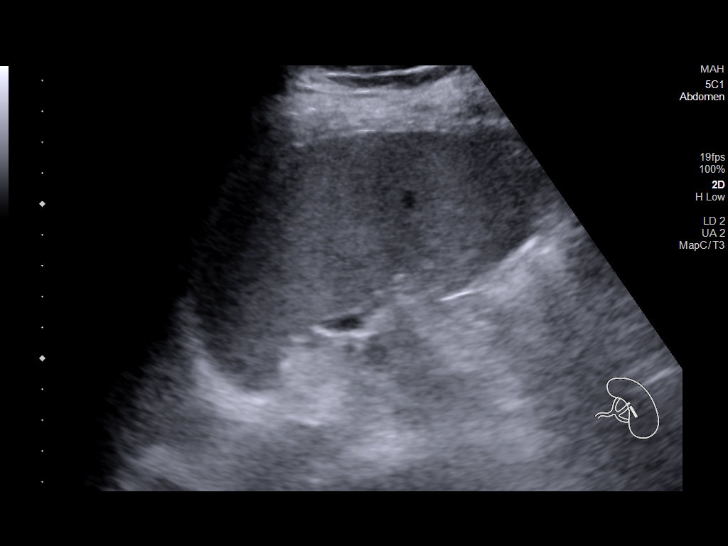
[im 56/90]
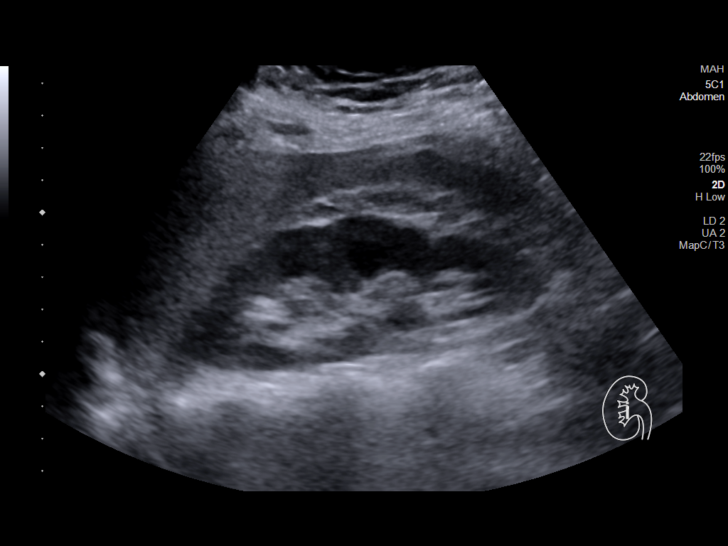
[im 60/90]
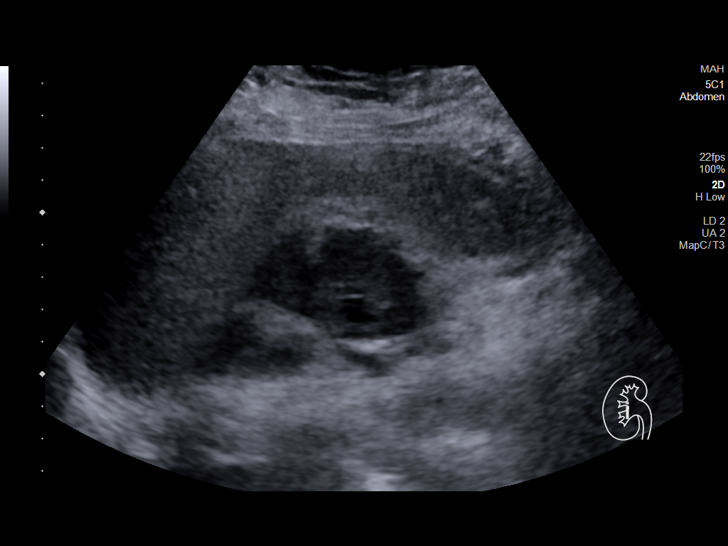
[im 67/90]
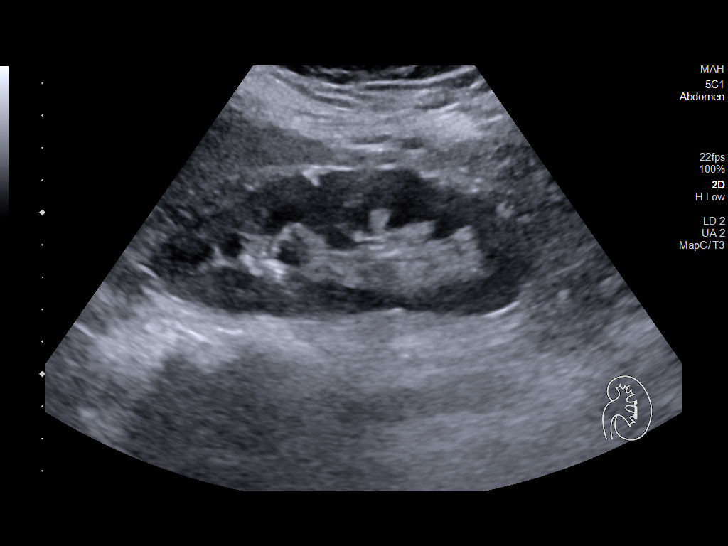
[im 75/90]
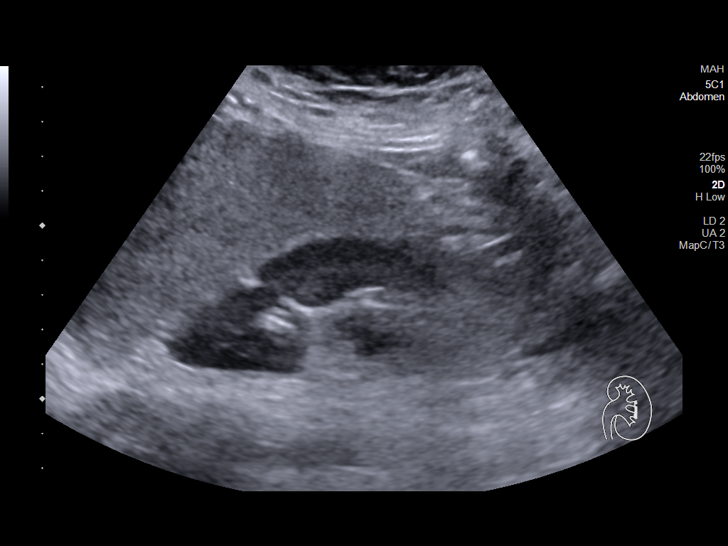
[im 82/90]
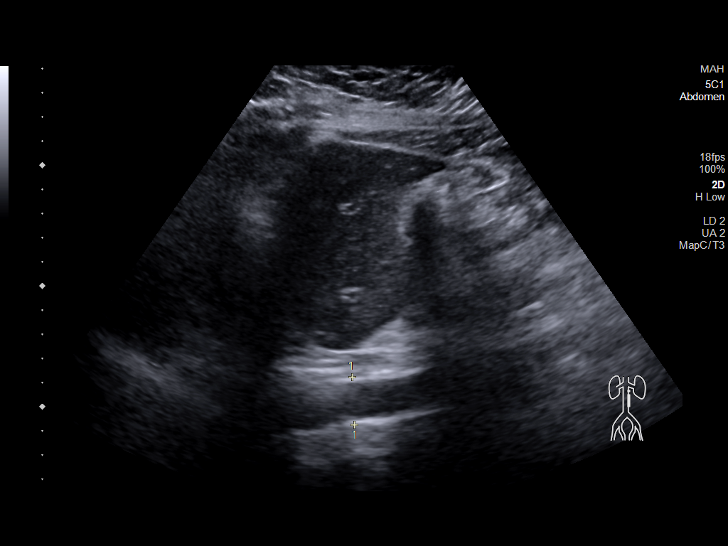
[im 90/90]
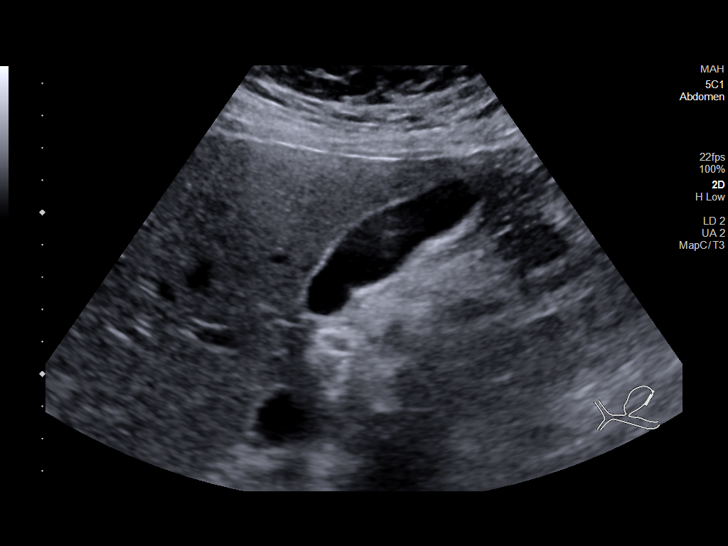

[14 of 25 positions shown; findings below may reference images not displayed]

FINDINGS: Gallbladder: No gallstones or wall thickening visualized. No
sonographic Murphy sign noted by sonographer.

Common bile duct: Diameter: 4.4 mm

Liver: Liver is slightly echogenic. No focal hepatic abnormality.
Portal vein is patent on color Doppler imaging with normal direction
of blood flow towards the liver.

IVC: No abnormality visualized.

Pancreas: Visualized portion unremarkable.

Spleen: Size and appearance within normal limits.

Right Kidney: Length: 11.5 cm. Echogenicity within normal limits. No
mass or hydronephrosis visualized.

Left Kidney: Length: 12 cm. Echogenicity within normal limits. No
mass or hydronephrosis visualized.

Abdominal aorta: No aneurysm visualized.

Other findings: Targeted ultrasound of the umbilical region
demonstrates no corresponding abnormality
IMPRESSION: 1. Slightly echogenic liver consistent with steatosis.
2. Otherwise negative abdominal ultrasound

## 2023-09-03 ENCOUNTER — Other Ambulatory Visit: Payer: Self-pay | Admitting: Medical-Surgical

## 2023-09-03 DIAGNOSIS — F902 Attention-deficit hyperactivity disorder, combined type: Secondary | ICD-10-CM

## 2023-09-03 NOTE — Telephone Encounter (Signed)
 Copied from CRM [id number]. Topic: Clinical - Medication Refill >> Sep 03, 2023 11:41 AM Emylou G wrote: Most Recent Primary Care Visit:  Provider: Christen Butter  Department: [facility]  Visit Type: OFFICE VISIT  Date: 05/07/2023  Medication: lisdexamfetamine (VYVANSE) 20 MG capsule  Has the patient contacted their pharmacy? Yes (Agent: If no, request that the patient contact the pharmacy for the refill. If patient does not wish to contact the pharmacy document the reason why and proceed with request.) (Agent: If yes, when and what did the pharmacy advise?) they said she needs new script  Is this the correct pharmacy for this prescription? Yes If no, delete pharmacy and type the correct one.  This is the patient's preferred pharmacy:  [id number] - [place], [place] - [address] AT [facility] OF [address] [place] [address] Phone: [phone] Fax: [phone]   Has the prescription been filled recently? yes  Is the patient out of the medication? Yes 2 days left  Has the patient been seen for an appointment in the last year OR does the patient have an upcoming appointment? Yes  Can we respond through MyChart? Yes  Agent: Please be advised that Rx refills may take up to 3 business days. We ask that you follow-up with your pharmacy.

## 2023-09-04 ENCOUNTER — Other Ambulatory Visit: Payer: Self-pay | Admitting: Medical-Surgical

## 2023-09-04 ENCOUNTER — Other Ambulatory Visit: Payer: Self-pay

## 2023-09-04 DIAGNOSIS — F902 Attention-deficit hyperactivity disorder, combined type: Secondary | ICD-10-CM

## 2023-09-04 MED ORDER — LISDEXAMFETAMINE DIMESYLATE 20 MG PO CAPS: 20.0000 mg | ORAL_CAPSULE | ORAL | 0 refills | Status: DC

## 2023-09-04 NOTE — Telephone Encounter (Signed)
 Requesting rx rf of vyvanse20mg   Last written 07/08/2023 Last OV 05/07/2023 Upcoming appt=11/05/2023 Patient father requesting refill for patient  Leaving for vacation today and requesting refill be sent today.

## 2023-09-04 NOTE — Telephone Encounter (Signed)
Refills sent in to the pharmacy on file.

## 2023-09-04 NOTE — Telephone Encounter (Signed)
 Copied from CRM [id number]. Topic: General - Other >> Sep 04, 2023  8:18 AM Dondra Prader E wrote: Reason for CRM: Pt's father called to report that she is completely out of her current supply and leaves for vacation soon. Hoping to receive her refill request of vyvanse today.

## 2023-09-16 ENCOUNTER — Ambulatory Visit: Payer: Self-pay | Admitting: Medical-Surgical

## 2023-09-16 NOTE — Telephone Encounter (Signed)
 Copied from CRM [id number]. Topic: Clinical - Red Word Triage >> Sep 16, 2023 12:07 PM Elle L wrote: Red Word that prompted transfer to Nurse Triage: The patient's Dad, Victoria Hunter, states that the patient takes Lexapro 20 MG once daily and she accidentally took it twice today. She has an upset stomach and he is requesting to speak to a Nurse to see if there is anything he should do.  Chief Complaint: took extra dose of Lexapro Symptoms: abd upset Frequency: constant. Pertinent Negatives: Patient denies NA; patient is in class and father reports just an upset stomach;  Disposition: [] ED /[] Urgent Care (no appt availability in office) / [] Appointment(In office/virtual)/ []  [place] [facility]/ [x] Home Care/ [] Refused Recommended Disposition /[] [place] [facility]/ []  Follow-up with PCP Additional Notes: per protocol poison control called with father on line.  Poison control suggests home care advice given, denies questions; instructed to go to ER if becomes worse and states symptoms should be mild and if she becomes tired it is ok to sleep.  Instructed to call back or to take her to UC/ER if becomes worse. Denies questions. Office pcp updated.   Reason for Disposition  [1] DOUBLE DOSE (an extra dose or lesser amount) of over-the-counter (OTC) drug AND [2] any symptoms (e.g., dizziness, nausea, pain, sleepiness)  Answer Assessment - Initial Assessment Questions Accidentally took an extra dose of lexapro, 40 mg instead of 20 mg.  This RN on phone with dad and poison control.  Protocols used: Medication Question Call-A-AH

## 2023-10-23 ENCOUNTER — Telehealth: Payer: Self-pay

## 2023-10-23 DIAGNOSIS — F902 Attention-deficit hyperactivity disorder, combined type: Secondary | ICD-10-CM

## 2023-10-23 MED ORDER — LISDEXAMFETAMINE DIMESYLATE 20 MG PO CAPS: 20.0000 mg | ORAL_CAPSULE | ORAL | 0 refills | Status: DC

## 2023-10-23 NOTE — Telephone Encounter (Signed)
 Patients father called stating that the patient is going to have bad withdrawals and is needing this medication sent to the pharmacy.  I advised Jai that you are still in clinic and will respond to the message when you are done in clinic.

## 2023-10-23 NOTE — Telephone Encounter (Signed)
 Copied from CRM [id number]. Topic: Clinical - Prescription Issue >> Oct 23, 2023  9:26 AM Eleanore Grey wrote: Reason for CRM: Patient's father Lenette Quick is calling in regarding patient's lisdexamfetamine (VYVANSE ) 20 MG capsule. Says the last fill at the pharmacy ([facility] on [address]) was only for 20 days due to shortage of medication and the patient is almost out, only has 2 days left. Pharmacy said they would need the doctor to call in a new prescription. Patient's dad said if there are any questions to contact him, [phone]

## 2023-10-23 NOTE — Telephone Encounter (Signed)
 Is it ok to fill the 11/03/2023 prescription early since they only received 20 tablets of the April prescription?

## 2023-10-23 NOTE — Telephone Encounter (Signed)
 Left message advising that there should be another prescription at the pharmacy that is on hold.

## 2023-10-23 NOTE — Telephone Encounter (Signed)
 Prescription on file has been resent with instructions to fill early and have this ready for them ASAP.    ___________________________________________ [name], DNP, APRN, FNP-BC Primary Care and Sports Medicine [facility]

## 2023-10-23 NOTE — Addendum Note (Signed)
 Addended byCherre Cornish on: 10/23/2023 05:26 PM   Modules accepted: Orders

## 2023-10-26 NOTE — Telephone Encounter (Signed)
 Copied from CRM [id number]. Topic: Clinical - Prescription Issue >> Oct 23, 2023  9:26 AM Eleanore Grey wrote: Reason for CRM: Patient's father Lenette Quick is calling in regarding patient's lisdexamfetamine (VYVANSE ) 20 MG capsule. Says the last fill at the pharmacy ([facility] on [address]) was only for 20 days due to shortage of medication and the patient is almost out, only has 2 days left. Pharmacy said they would need the doctor to call in a new prescription. Patient's dad said if there are any questions to contact him, [phone]

## 2023-10-26 NOTE — Telephone Encounter (Signed)
Patient has picked up prescription.

## 2023-11-05 ENCOUNTER — Encounter: Payer: Self-pay | Admitting: Medical-Surgical

## 2023-11-05 ENCOUNTER — Telehealth: Payer: Managed Care, Other (non HMO) | Admitting: Medical-Surgical

## 2023-11-05 DIAGNOSIS — F331 Major depressive disorder, recurrent, moderate: Secondary | ICD-10-CM

## 2023-11-05 DIAGNOSIS — F902 Attention-deficit hyperactivity disorder, combined type: Secondary | ICD-10-CM

## 2023-11-05 DIAGNOSIS — F411 Generalized anxiety disorder: Secondary | ICD-10-CM

## 2023-11-05 MED ORDER — LISDEXAMFETAMINE DIMESYLATE 20 MG PO CAPS: 20.0000 mg | ORAL_CAPSULE | ORAL | 0 refills | Status: DC

## 2023-11-05 NOTE — Progress Notes (Signed)
 Virtual Visit via Video Note  I connected with Victoria Hunter on 11/05/23 at 11:10 AM EDT by a video enabled telemedicine application and verified that I am speaking with the correct person using two identifiers.   I discussed the limitations of evaluation and management by telemedicine and the availability of in person appointments. The patient expressed understanding and agreed to proceed.  Patient location: home Provider locations: office  Subjective:    CC: Mood/ADHD follow-up  HPI: Very pleasant 23 year old female presenting today via MyChart video visit to discuss mood and ADHD.  Mood: Taking Lexapro  20 mg and Wellbutrin  450 mg daily, tolerating both well without side effects.  Feels these worked well to keep her mood stabilized and does not currently have any concerns regarding uncontrolled symptoms.  Happy with her regimen and is not desiring a change.  ADHD: Taking Vyvanse  20 mg daily, tolerating well without side effects.  Feels the medication is working well to keep her focused.  No interference with sleeping pattern, appetite, or weight.  No worsened anxiety or palpitations noted.   Past medical history, Surgical history, Family history not pertinant except as noted below, Social history, Allergies, and medications have been entered into the medical record, reviewed, and corrections made.   Review of Systems: See HPI for pertinent positives and negatives.   Objective:    General: Speaking clearly in complete sentences without any shortness of breath.  Alert and oriented x3.  Normal judgment. No apparent acute distress.  Impression and Recommendations:    1. Generalized anxiety disorder (Primary) 2. Major depressive disorder, recurrent episode, moderate (HCC) Stable and well-controlled.  Continue Lexapro  and Wellbutrin  as prescribed.  3. Attention deficit hyperactivity disorder, combined type Stable and well-controlled.  Continue Vyvanse  as prescribed.  Advised patient to  send me a MyChart message when she picks up her third prescription for Vyvanse  so that I can send the remaining 3 months in.  Plan to follow-up in office in 6 months. - lisdexamfetamine (VYVANSE ) 20 MG capsule; Take 1 capsule (20 mg total) by mouth every morning.  Dispense: 30 capsule; Refill: 0 - lisdexamfetamine (VYVANSE ) 20 MG capsule; Take 1 capsule (20 mg total) by mouth every morning.  Dispense: 30 capsule; Refill: 0 - lisdexamfetamine (VYVANSE ) 20 MG capsule; Take 1 capsule (20 mg total) by mouth every morning.  Dispense: 30 capsule; Refill: 0  I discussed the assessment and treatment plan with the patient. The patient was provided an opportunity to ask questions and all were answered. The patient agreed with the plan and demonstrated an understanding of the instructions.   The patient was advised to call back or seek an in-person evaluation if the symptoms worsen or if the condition fails to improve as anticipated.  Return in about 6 months (around 05/07/2024) for ADHD/mood follow-up.  [name], DNP, APRN, FNP-BC [facility] and Sports Medicine

## 2024-02-02 ENCOUNTER — Other Ambulatory Visit: Payer: Self-pay | Admitting: Medical-Surgical

## 2024-02-02 DIAGNOSIS — F331 Major depressive disorder, recurrent, moderate: Secondary | ICD-10-CM

## 2024-02-02 DIAGNOSIS — F411 Generalized anxiety disorder: Secondary | ICD-10-CM

## 2024-03-04 ENCOUNTER — Ambulatory Visit (INDEPENDENT_AMBULATORY_CARE_PROVIDER_SITE_OTHER): Admitting: Medical-Surgical

## 2024-03-04 ENCOUNTER — Encounter: Payer: Self-pay | Admitting: Medical-Surgical

## 2024-03-04 ENCOUNTER — Ambulatory Visit

## 2024-03-04 VITALS — BP 118/77 | HR 111 | Resp 20 | Ht 64.0 in | Wt 175.0 lb

## 2024-03-04 DIAGNOSIS — M25561 Pain in right knee: Secondary | ICD-10-CM

## 2024-03-04 DIAGNOSIS — G4719 Other hypersomnia: Secondary | ICD-10-CM | POA: Diagnosis not present

## 2024-03-04 DIAGNOSIS — M25562 Pain in left knee: Secondary | ICD-10-CM

## 2024-03-04 DIAGNOSIS — G8929 Other chronic pain: Secondary | ICD-10-CM

## 2024-03-04 DIAGNOSIS — F902 Attention-deficit hyperactivity disorder, combined type: Secondary | ICD-10-CM

## 2024-03-04 MED ORDER — LISDEXAMFETAMINE DIMESYLATE 20 MG PO CAPS: 20.0000 mg | ORAL_CAPSULE | ORAL | 0 refills | Status: DC

## 2024-03-04 NOTE — Progress Notes (Addendum)
 Established patient visit   History of Present Illness   Discussed the use of AI scribe software for clinical note transcription with the patient, who gave verbal consent to proceed.  History of Present Illness   Victoria Hunter is a 23 year old female who presents with bilateral knee pain and locking. She is accompanied by her father.  Bilateral knee pain and locking - Bilateral knee pain and locking since age 42 - Sensation of kneecap sliding off with incorrect movement - Locking episodes occur every few years, with increasing frequency and severity over time - Initial episodes resolved in 30 minutes; most recent episode lasted two hours, resulting in immobility and significant pain - Knee swelling, erythema, and doubling in size during episodes, obscuring the kneecap - Episodes severely impact daily activities - Locking episodes more frequent during periods of increased physical activity - No recent imaging; last studies performed several years ago - Uses Tylenol /Ibuprofen for pain but not effective during locking episodes  Response to physical therapy - Physical therapy exercises previously attempted without improvement - Lack of definitive diagnosis limited effectiveness of physical therapy  Sleep disturbances and daytime fatigue - Alternating periods of hypersomnia and insomnia - Daytime fatigue and difficulty waking  Attention deficit hyperactivity disorder (adhd) - Takes Vyvanse  20 mg daily for ADHD       Physical Exam   Physical Exam Vitals reviewed.  Constitutional:      General: She is not in acute distress.    Appearance: Normal appearance. She is not ill-appearing.  HENT:     Head: Normocephalic and atraumatic.  Cardiovascular:     Rate and Rhythm: Normal rate and regular rhythm.     Pulses: Normal pulses.     Heart sounds: Normal heart sounds. No murmur heard.    No friction rub. No gallop.  Pulmonary:     Effort: Pulmonary effort is normal. No  respiratory distress.     Breath sounds: Normal breath sounds. No wheezing.  Musculoskeletal:     Right knee: No swelling, effusion, erythema, bony tenderness or crepitus. Normal range of motion. No tenderness.     Left knee: No swelling, effusion, erythema, bony tenderness or crepitus. Normal range of motion. No tenderness.  Skin:    General: Skin is warm and dry.  Neurological:     Mental Status: She is alert and oriented to person, place, and time.  Psychiatric:        Mood and Affect: Mood normal.        Behavior: Behavior normal.        Thought Content: Thought content normal.        Judgment: Judgment normal.    Assessment & Plan   Assessment and Plan    Bilateral knee pain with mechanical symptoms and suspected meniscal derangement Chronic knee pain with mechanical symptoms suggestive of meniscal derangement. Symptoms include popping, clicking, locking, swelling, and redness, particularly in the right knee. Has failed at least 6 weeks of conservative measures with home exercises and anti-inflammatories in the last 3 months. Previous imaging outdated, requiring updated evaluation. - Order bilateral knee x-rays. - Order bilateral knee MRIs for evaluation of mechanical symptoms. - Consider referral to orthopedics based on MRI results.  Hypersomnia and irregular sleep-wake pattern Hypersomnia with excessive daytime sleepiness and irregular sleep-wake pattern. Discussed potential sleep disorder, including central sleep apnea. - Order home sleep study.  Attention-deficit hyperactivity disorder ADHD managed with Vyvanse  20 mg daily. Sleep issues present but not  directly related to Vyvanse . - Continue Vyvanse  20 mg daily. - Schedule six-month follow-up for ADHD management.      Follow up   Return in about 6 months (around 09/01/2024) for ADHD follow up. __________________________________ Zada FREDRIK Palin, DNP, APRN, FNP-BC Primary Care and Sports Medicine [facility]  [place]

## 2024-03-14 ENCOUNTER — Ambulatory Visit: Payer: Self-pay | Admitting: Medical-Surgical

## 2024-03-14 ENCOUNTER — Telehealth: Payer: Self-pay

## 2024-03-14 DIAGNOSIS — G8929 Other chronic pain: Secondary | ICD-10-CM

## 2024-03-14 NOTE — Telephone Encounter (Signed)
 Copied from CRM #[id number]. Topic: Clinical - Medication Prior Auth >> Mar 11, 2024  1:27 PM Marda MATSU wrote: Zai with [employer] is calling in regards to an MRI that was denied.  MRI lower extremity w/o contrast Cpt code 26278  Authorization has been denied You can do a peer to peer [phone] option 4 option 2 option 1 option 2   Case wn#[id number]

## 2024-03-16 NOTE — Telephone Encounter (Signed)
 Faxed =kph

## 2024-03-18 ENCOUNTER — Telehealth: Payer: Self-pay | Admitting: Medical-Surgical

## 2024-03-18 NOTE — Telephone Encounter (Signed)
 Copied from CRM [id number]. Topic: General - Other >> Mar 18, 2024  1:53 PM Kevelyn M wrote: Reason for CRM: Prior Auth denied for MRI code 26278 for lower extremity. Was denied. Will need to complete a peer to peer.   Call back # [phone] opt 4, 2,1, 2  Service order #[id number]

## 2024-03-27 ENCOUNTER — Ambulatory Visit (INDEPENDENT_AMBULATORY_CARE_PROVIDER_SITE_OTHER)

## 2024-03-27 DIAGNOSIS — G8929 Other chronic pain: Secondary | ICD-10-CM

## 2024-03-27 DIAGNOSIS — M25561 Pain in right knee: Secondary | ICD-10-CM | POA: Diagnosis not present

## 2024-03-27 DIAGNOSIS — M25562 Pain in left knee: Secondary | ICD-10-CM | POA: Diagnosis not present

## 2024-03-29 ENCOUNTER — Encounter: Payer: Self-pay | Admitting: Medical-Surgical

## 2024-03-29 NOTE — Telephone Encounter (Signed)
Pended referral to sports medicine

## 2024-05-09 ENCOUNTER — Other Ambulatory Visit: Payer: Self-pay | Admitting: Medical-Surgical

## 2024-05-09 DIAGNOSIS — F331 Major depressive disorder, recurrent, moderate: Secondary | ICD-10-CM

## 2024-05-09 DIAGNOSIS — F411 Generalized anxiety disorder: Secondary | ICD-10-CM

## 2024-05-09 MED ORDER — ESCITALOPRAM OXALATE 20 MG PO TABS: 20.0000 mg | ORAL_TABLET | Freq: Every day | ORAL | 1 refills | Status: AC

## 2024-05-09 NOTE — Telephone Encounter (Signed)
 Copied from CRM #[id number]. Topic: Clinical - Medication Refill >> May 09, 2024  8:14 AM Rosaria A wrote: Medication: escitalopram  (LEXAPRO ) 20 MG tablet  Has the patient contacted their pharmacy? Yes Pharmacy sent in an emergency refill for this weekend. Patients dad called in on Friday regarding needing a refill. Patients father is requesting the medication to be sent in today.  This is the patient's preferred pharmacy:  [id number] - [place], [place] - [address] [facility] [address] [place] [address] Phone: [phone] Fax: [phone]  Is this the correct pharmacy for this prescription? Yes If no, delete pharmacy and type the correct one.   Has the prescription been filled recently? Yes. Emergency refill at Pharmacy for the weekend.   Is the patient out of the medication? Yes  Has the patient been seen for an appointment in the last year OR does the patient have an upcoming appointment? Yes  Can we respond through MyChart? No  Agent: Please be advised that Rx refills may take up to 3 business days. We ask that you follow-up with your pharmacy.

## 2024-06-21 ENCOUNTER — Other Ambulatory Visit: Payer: Self-pay | Admitting: Medical-Surgical

## 2024-06-21 DIAGNOSIS — F902 Attention-deficit hyperactivity disorder, combined type: Secondary | ICD-10-CM

## 2024-06-21 MED ORDER — LISDEXAMFETAMINE DIMESYLATE 20 MG PO CAPS: 20.0000 mg | ORAL_CAPSULE | ORAL | 0 refills | Status: AC

## 2024-06-21 NOTE — Telephone Encounter (Signed)
 Copied from CRM #[id number]. Topic: Clinical - Medication Refill >> Jun 21, 2024 11:29 AM Mia F wrote: Medication: lisdexamfetamine  (VYVANSE ) 20 MG capsule   Has the patient contacted their pharmacy? Yes (Agent: If no, request that the patient contact the pharmacy for the refill. If patient does not wish to contact the pharmacy document the reason why and proceed with request.) (Agent: If yes, when and what did the pharmacy advise?)  This is the patient's preferred pharmacy:  [id number] - [place], [place] - [address] AT [facility] [address] [place] [address] Phone: [phone] Fax: [phone]  Is this the correct pharmacy for this prescription? Yes If no, delete pharmacy and type the correct one.   Has the prescription been filled recently? Yes  Is the patient out of the medication? No  Has the patient been seen for an appointment in the last year OR does the patient have an upcoming appointment? Yes  Can we respond through MyChart? Yes  Agent: Please be advised that Rx refills may take up to 3 business days. We ask that you follow-up with your pharmacy.

## 2024-09-01 ENCOUNTER — Ambulatory Visit: Admitting: Medical-Surgical
